# Patient Record
Sex: Male | Born: 1980 | Race: White | Hispanic: No | Marital: Married | Smoking: Never smoker
Health system: Southern US, Community
[De-identification: ages and names within clinical notes are randomized; demographics above are authoritative.]

---

## 2016-05-08 ENCOUNTER — Emergency Department (HOSPITAL_BASED_OUTPATIENT_CLINIC_OR_DEPARTMENT_OTHER): Payer: Commercial Indemnity

## 2016-05-08 ENCOUNTER — Encounter (HOSPITAL_BASED_OUTPATIENT_CLINIC_OR_DEPARTMENT_OTHER): Payer: Self-pay | Admitting: Emergency Medicine

## 2016-05-08 ENCOUNTER — Emergency Department (HOSPITAL_BASED_OUTPATIENT_CLINIC_OR_DEPARTMENT_OTHER)
Admission: EM | Admit: 2016-05-08 | Discharge: 2016-05-08 | Disposition: A | Discharge status: Home / Self Care | Payer: Commercial Indemnity | Attending: Emergency Medicine | Admitting: Emergency Medicine

## 2016-05-08 DIAGNOSIS — R61 Generalized hyperhidrosis: Secondary | ICD-10-CM | POA: Diagnosis not present

## 2016-05-08 DIAGNOSIS — R110 Nausea: Secondary | ICD-10-CM | POA: Insufficient documentation

## 2016-05-08 DIAGNOSIS — M549 Dorsalgia, unspecified: Secondary | ICD-10-CM | POA: Diagnosis not present

## 2016-05-08 DIAGNOSIS — R6889 Other general symptoms and signs: Secondary | ICD-10-CM

## 2016-05-08 DIAGNOSIS — R5383 Other fatigue: Secondary | ICD-10-CM | POA: Diagnosis not present

## 2016-05-08 DIAGNOSIS — R05 Cough: Secondary | ICD-10-CM | POA: Insufficient documentation

## 2016-05-08 DIAGNOSIS — R509 Fever, unspecified: Secondary | ICD-10-CM | POA: Insufficient documentation

## 2016-05-08 LAB — COMPREHENSIVE METABOLIC PANEL
ALK PHOS: 54 U/L (ref 38–126)
ALT: 46 U/L (ref 17–63)
ANION GAP: 9 (ref 5–15)
AST: 32 U/L (ref 15–41)
Albumin: 4.2 g/dL (ref 3.5–5.0)
BUN: 11 mg/dL (ref 6–20)
CALCIUM: 9.3 mg/dL (ref 8.9–10.3)
CHLORIDE: 98 mmol/L — AB (ref 101–111)
CO2: 30 mmol/L (ref 22–32)
Creatinine, Ser: 1 mg/dL (ref 0.61–1.24)
GFR calc non Af Amer: >60 mL/min (ref >60)
GLUCOSE: 113 mg/dL — AB (ref 65–99)
POTASSIUM: 3.5 mmol/L (ref 3.5–5.1)
SODIUM: 137 mmol/L (ref 135–145)
Total Bilirubin: 0.7 mg/dL (ref 0.3–1.2)
Total Protein: 8.1 g/dL (ref 6.5–8.1)

## 2016-05-08 LAB — CBC WITH DIFFERENTIAL/PLATELET
BASOS PCT: 0 %
Basophils Absolute: 0 10*3/uL (ref 0.0–0.1)
EOS PCT: 1 %
Eosinophils Absolute: 0.1 10*3/uL (ref 0.0–0.7)
HCT: 44.9 % (ref 39.0–52.0)
Hemoglobin: 15.2 g/dL (ref 13.0–17.0)
LYMPHS ABS: 1.3 10*3/uL (ref 0.7–4.0)
Lymphocytes Relative: 14 %
MCH: 29.5 pg (ref 26.0–34.0)
MCHC: 33.9 g/dL (ref 30.0–36.0)
MCV: 87 fL (ref 78.0–100.0)
MONOS PCT: 16 %
Monocytes Absolute: 1.5 10*3/uL — ABNORMAL HIGH (ref 0.1–1.0)
NEUTROS PCT: 69 %
Neutro Abs: 6.4 10*3/uL (ref 1.7–7.7)
PLATELETS: 191 10*3/uL (ref 150–400)
RBC: 5.16 MIL/uL (ref 4.22–5.81)
RDW: 13 % (ref 11.5–15.5)
WBC: 9.2 10*3/uL (ref 4.0–10.5)

## 2016-05-08 LAB — URINALYSIS, MICROSCOPIC (REFLEX)

## 2016-05-08 LAB — URINALYSIS, ROUTINE W REFLEX MICROSCOPIC
BILIRUBIN URINE: NEGATIVE
Glucose, UA: NEGATIVE mg/dL
KETONES UR: NEGATIVE mg/dL
LEUKOCYTES UA: NEGATIVE
NITRITE: NEGATIVE
PH: 6.5 (ref 5.0–8.0)
Protein, ur: NEGATIVE mg/dL
SPECIFIC GRAVITY, URINE: 1.011 (ref 1.005–1.030)

## 2016-05-08 MED ORDER — DM-GUAIFENESIN ER 30-600 MG PO TB12: 1.0000 | ORAL_TABLET | Freq: Two times a day (BID) | ORAL | 1 refills | Status: AC

## 2016-05-08 MED ORDER — ONDANSETRON HCL 4 MG/2ML IJ SOLN
4.0000 mg | Freq: Once | INTRAMUSCULAR | Status: DC
  Filled 2016-05-08: qty 2

## 2016-05-08 MED ORDER — SODIUM CHLORIDE 0.9 % IV BOLUS (SEPSIS)
1000.0000 mL | Freq: Once | INTRAVENOUS | Status: AC
  Administered 2016-05-08: 1000 mL via INTRAVENOUS

## 2016-05-08 MED ORDER — NAPROXEN 500 MG PO TABS: 500.0000 mg | ORAL_TABLET | Freq: Two times a day (BID) | ORAL | 1 refills | Status: AC

## 2016-05-08 MED ORDER — SODIUM CHLORIDE 0.9 % IV SOLN: INTRAVENOUS | Status: DC

## 2016-05-08 NOTE — ED Provider Notes (Signed)
[facility] Note   CSN: [id number] Arrival date & time: 05/08/16  2007   By signing my name below, I, Nelwyn SalisburyJoshua Fowler, attest that this documentation has been prepared under the direction and in the presence of Vanetta MuldersScott [place], MD . Electronically Signed: Nelwyn SalisburyJoshua Fowler, Scribe. 05/08/2016. 9:22 PM.  History   Chief Complaint Chief Complaint  Patient presents with  . Back Pain   The history is provided by the patient. No language interpreter was used.    HPI Comments:  Brad Delgado is an otherwise healthy 35 y.o. male who presents to the Emergency Department complaining of sudden-onset intermittent fever beginning earlier today. Pt states that he tried sleeping for his symptoms with moderate relief. He reports associated nausea, fatigue, diaphoresis, chills, cough, and nausea. He denies any rhinorrhea, sore throat, visual disturbance, SOB, CP, abdominal pain, diarrhea, vomiting, dysuria, joint swelling, rash, headache, numbness or bleeding problems. Pt states he has had some sick contacts recently.  Pt secondarily complains of constant unchanged back pain beginning about a week ago. Pt states that his pain sometimes prevents him from tying his shoes. He denies any injury to his back.  History reviewed. No pertinent past medical history.  There are no active problems to display for this patient.   History reviewed. No pertinent surgical history.   Home Medications    Prior to Admission medications   Medication Sig Start Date End Date Taking? Authorizing Provider  dextromethorphan-guaiFENesin (MUCINEX DM) 30-600 MG 12hr tablet Take 1 tablet by mouth 2 (two) times daily. 05/08/16   Vanetta MuldersScott Kysean Sweet, MD  naproxen (NAPROSYN) 500 MG tablet Take 1 tablet (500 mg total) by mouth 2 (two) times daily. 05/08/16   Vanetta MuldersScott Luvia Orzechowski, MD    Family History History reviewed. No pertinent family history.  Social History Social History  Substance Use Topics  . Smoking  status: Never Smoker  . Smokeless tobacco: Never Used  . Alcohol use No     Allergies   Asa [aspirin]   Review of Systems Review of Systems  Constitutional: Positive for chills, diaphoresis, fatigue and fever.  HENT: Negative for rhinorrhea and sore throat.   Eyes: Negative for visual disturbance.  Respiratory: Positive for cough (Productive). Negative for shortness of breath.   Cardiovascular: Negative for chest pain.  Gastrointestinal: Positive for nausea. Negative for abdominal pain, diarrhea and vomiting.  Genitourinary: Negative for dysuria.  Musculoskeletal: Positive for back pain. Negative for joint swelling.  Skin: Negative for rash and wound.  Neurological: Negative for numbness and headaches.  Hematological: Does not bruise/bleed easily.     Physical Exam Updated Vital Signs BP 113/74 (BP Location: Right Arm)   Pulse 103   Temp 98 F (36.7 C) (Oral)   Resp 20   Ht 5\' 11"  (1.803 m)   Wt 117.9 kg   SpO2 97%   BMI 36.26 kg/m   Physical Exam  Constitutional: He is oriented to person, place, and time. He appears well-developed and well-nourished. No distress.  HENT:  Head: Normocephalic and atraumatic.  Mouth/Throat: Oropharynx is clear and moist.  Eyes: Conjunctivae and EOM are normal. Pupils are equal, round, and reactive to light. No scleral icterus.  Cardiovascular: Normal rate, regular rhythm and normal heart sounds.   Pulmonary/Chest: Effort normal and breath sounds normal.  Abdominal: Soft. Bowel sounds are normal. He exhibits no distension. There is no tenderness.  Musculoskeletal:  No ankle swelling  Neurological: He is alert and oriented to person, place, and time. No cranial nerve deficit  or sensory deficit. He exhibits normal muscle tone. Coordination normal.  Skin: Skin is warm and dry.  Psychiatric: He has a normal mood and affect.  Nursing note and vitals reviewed.    ED Treatments / Results  DIAGNOSTIC STUDIES:  Oxygen Saturation is 96%  on RA, adequate by my interpretation.    COORDINATION OF CARE:  9:37 PM Discussed treatment plan with pt at bedside which includes imaging and blood work and pt agreed to plan.  Labs (all labs ordered are listed, but only abnormal results are displayed) Labs Reviewed  URINALYSIS, ROUTINE W REFLEX MICROSCOPIC - Abnormal; Notable for the following:       Result Value   Hgb urine dipstick MODERATE (*)    All other components within normal limits  URINALYSIS, MICROSCOPIC (REFLEX) - Abnormal; Notable for the following:    Bacteria, UA RARE (*)    Squamous Epithelial / LPF 0-5 (*)    All other components within normal limits  CBC WITH DIFFERENTIAL/PLATELET - Abnormal; Notable for the following:    Monocytes Absolute 1.5 (*)    All other components within normal limits  COMPREHENSIVE METABOLIC PANEL - Abnormal; Notable for the following:    Chloride 98 (*)    Glucose, Bld 113 (*)    All other components within normal limits    EKG  EKG Interpretation None       Radiology Dg Chest 2 View  Result Date: 05/08/2016 CLINICAL DATA:  35 year old male with fever and cough. EXAM: CHEST  2 VIEW COMPARISON:  None. FINDINGS: The heart size and mediastinal contours are within normal limits. Both lungs are clear. The visualized skeletal structures are unremarkable. IMPRESSION: No active cardiopulmonary disease. Electronically Signed   By: Elgie CollardArash  Radparvar M.D.   On: 05/08/2016 22:02    Procedures Procedures (including critical care time)  Medications Ordered in ED Medications  0.9 %  sodium chloride infusion (not administered)  ondansetron (ZOFRAN) injection 4 mg (not administered)  sodium chloride 0.9 % bolus 1,000 mL (1,000 mLs Intravenous New Bag/Given 05/08/16 2200)     Initial Impression / Assessment and Plan / ED Course  I have reviewed the triage vital signs and the nursing notes.  Pertinent labs & imaging results that were available during my care of the patient were reviewed  by me and considered in my medical decision making (see chart for details).  Clinical Course    Patient nontoxic no acute distress. Symptoms do appear to be consistent with a flulike illness. Chest x-ray negative for pneumonia. Will treat symptomatically. Patient did not have flu shot. Based on symptoms being fairly minimal at this point in time will not start Tamiflu. Patient will return for any new or worse symptoms.   Final Clinical Impressions(s) / ED Diagnoses   Final diagnoses:  Flu-like symptoms    New Prescriptions New Prescriptions   DEXTROMETHORPHAN-GUAIFENESIN (MUCINEX DM) 30-600 MG 12HR TABLET    Take 1 tablet by mouth 2 (two) times daily.   NAPROXEN (NAPROSYN) 500 MG TABLET    Take 1 tablet (500 mg total) by mouth 2 (two) times daily.  I personally performed the services described in this documentation, which was scribed in my presence. The recorded information has been reviewed and is accurate.       Vanetta MuldersScott Marguerite Barba, MD 05/08/16 [phone]

## 2016-05-08 NOTE — ED Notes (Signed)
Patient became pasty white while sticking him for an IV and drawing blood. Then c/o nausea

## 2016-05-08 NOTE — ED Triage Notes (Addendum)
Patient reports that he is having lower back pain across his back, patient reports that it feels like it is pulling when he bends over this is the only time that he feels the pain . The patient reports that he started to have a fever last night.  Patient was exposed to someone with the flu about 1 week ago. Started having a cough 2 days ago

## 2016-05-08 NOTE — ED Notes (Signed)
Patient states he started feeling bad yesterday.  +fever, sleepy, back pain at mid lower back.  Has been able to drink some fluids, been taking OTC meds for fever and cough.  Slight nausea no vomiting or diarrhea.  States he did have fever at home but began sweating on the wat here

## 2016-05-08 NOTE — Discharge Instructions (Signed)
Chest x-ray negative for pneumonia. Symptoms consistent with a flulike illness. Treat symptomatically. Drink plenty of fluids. Work note provided. Return for any new or worse symptoms at all.

## 2016-05-08 NOTE — ED Notes (Signed)
Pt and wife given d/c instructions as per chart. Rx x 2. Verbalize understanding. No questions.

## 2016-05-08 NOTE — ED Notes (Signed)
No longer c/o nausea requesting to wait on the nausea med

## 2017-11-29 ENCOUNTER — Other Ambulatory Visit: Payer: Self-pay | Admitting: Neurological Surgery

## 2017-11-29 DIAGNOSIS — M545 Low back pain: Secondary | ICD-10-CM

## 2017-12-01 ENCOUNTER — Ambulatory Visit
Admission: RE | Admit: 2017-12-01 | Discharge: 2017-12-01 | Disposition: A | Discharge status: Home / Self Care | Payer: Commercial Indemnity | Source: Ambulatory Visit | Attending: Neurological Surgery | Admitting: Neurological Surgery

## 2017-12-01 DIAGNOSIS — M545 Low back pain: Secondary | ICD-10-CM

## 2019-09-29 IMAGING — MR MR LUMBAR SPINE W/O CM
4 of 5 series · 18 of 48 positions shown · non-contrast
Comparison: None available.

CLINICAL DATA: Initial evaluation for low back pain with left lower
extremity pain for over 1 year.

EXAM:
MRI LUMBAR SPINE WITHOUT CONTRAST
TECHNIQUE: Multiplanar, multisequence MR imaging of the lumbar spine was
performed. No intravenous contrast was administered.

[Series 5: T2 · sagittal · 4.0mm · 0.73mm/px · 6 of 13 slices shown (1 of 2)]
[im 1/13]
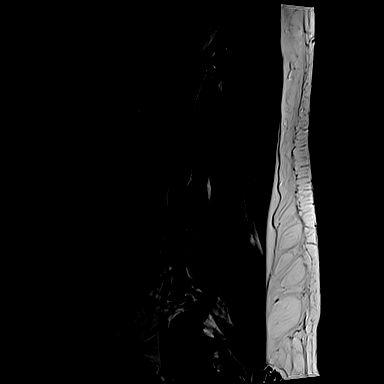
[im 3/13]
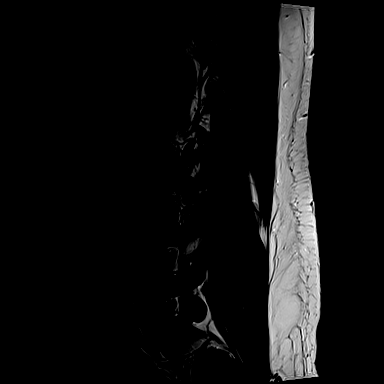
[im 5/13]
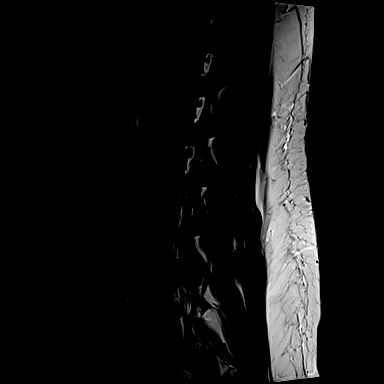
[im 8/13]
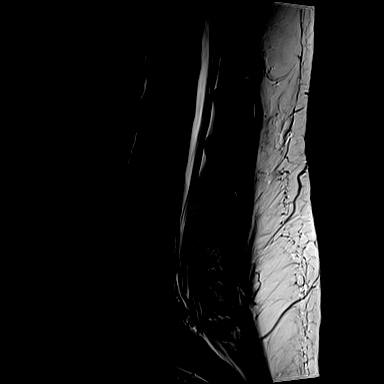
[im 10/13]
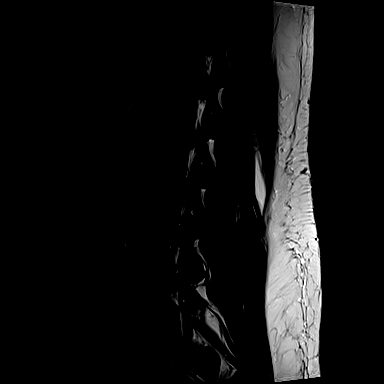
[im 13/13]
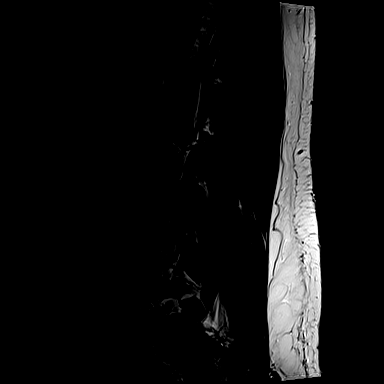

[Series 6: T1 · sagittal · 4.0mm · 0.73mm/px · 3 of 13 slices shown (1 of 2)]
[im 1/13]
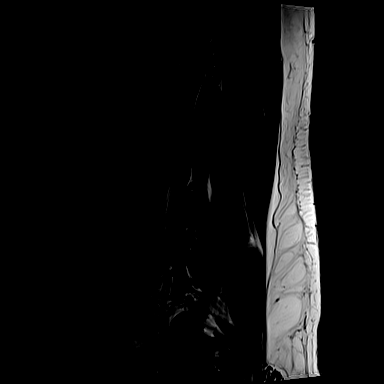
[im 7/13]
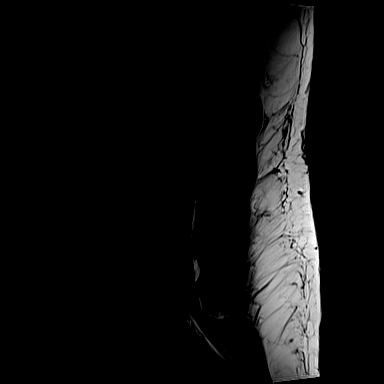
[im 13/13]
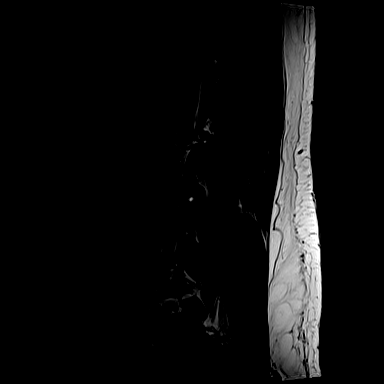

[Series 10: T1 · axial · 4.0mm · 0.30mm/px · z∈[-111,+56]mm · 3 of 40 slices shown (2 of 2)]
[im 6/40]
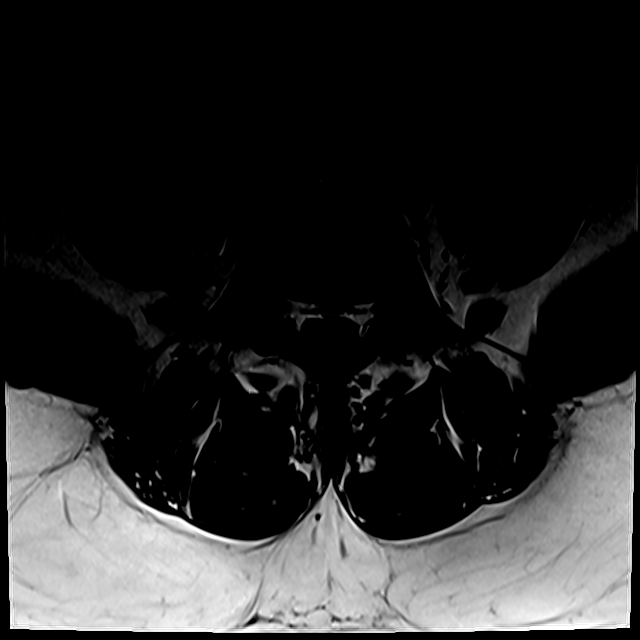
[im 21/40]
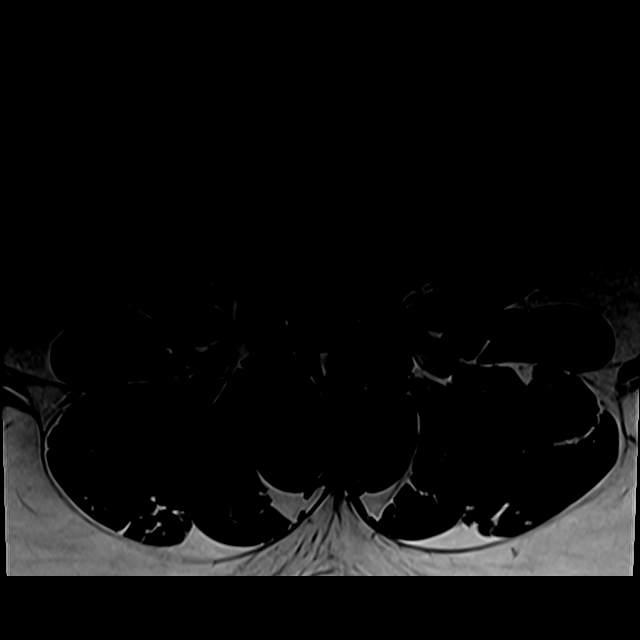
[im 34/40]
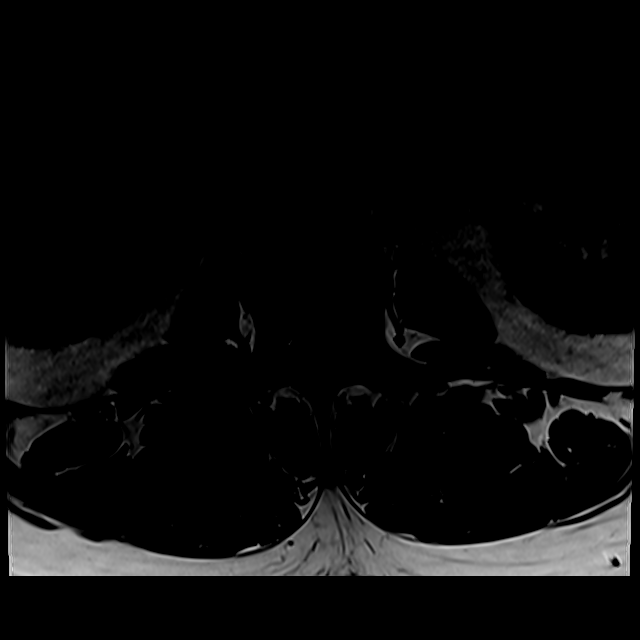

[Series 13: T2 · axial · 4.0mm · 0.30mm/px · z∈[-126,+56]mm · 6 of 40 slices shown (2 of 2)]
[im 3/40]
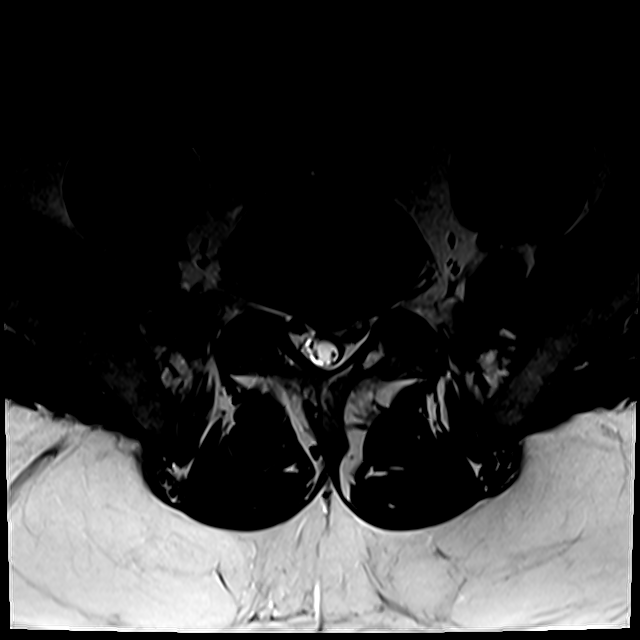
[im 6/40]
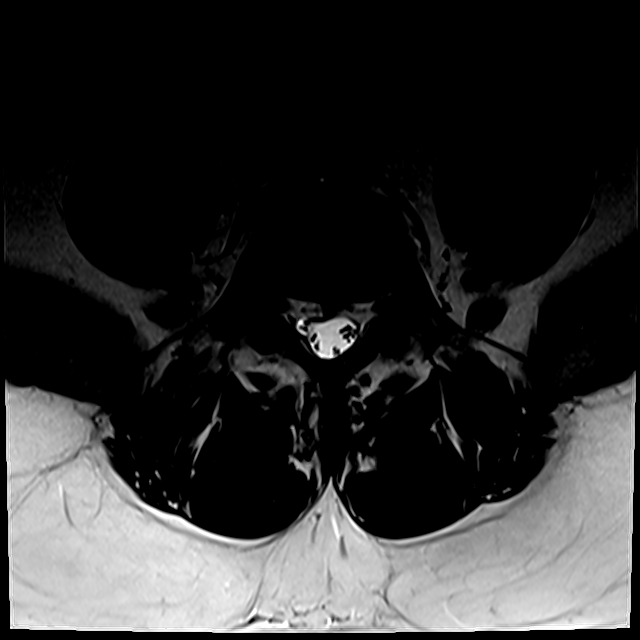
[im 8/40]
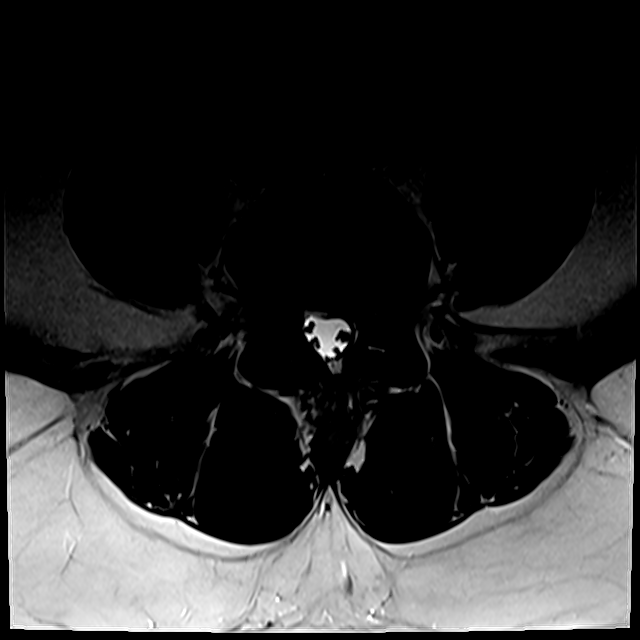
[im 14/40]
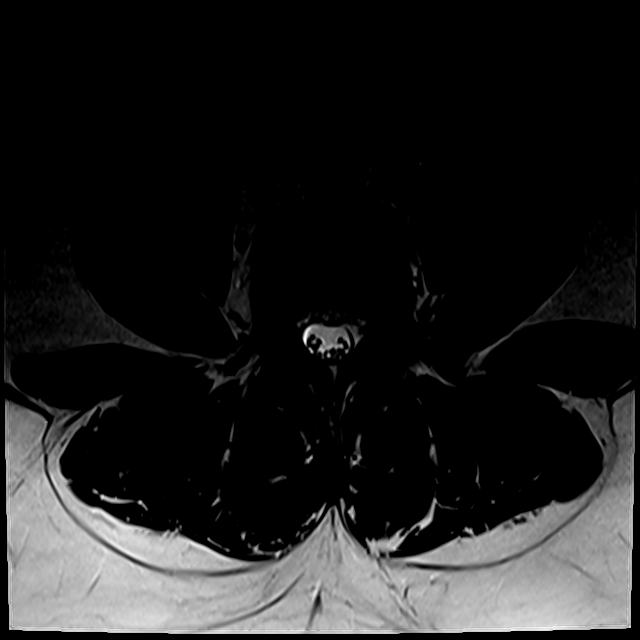
[im 21/40]
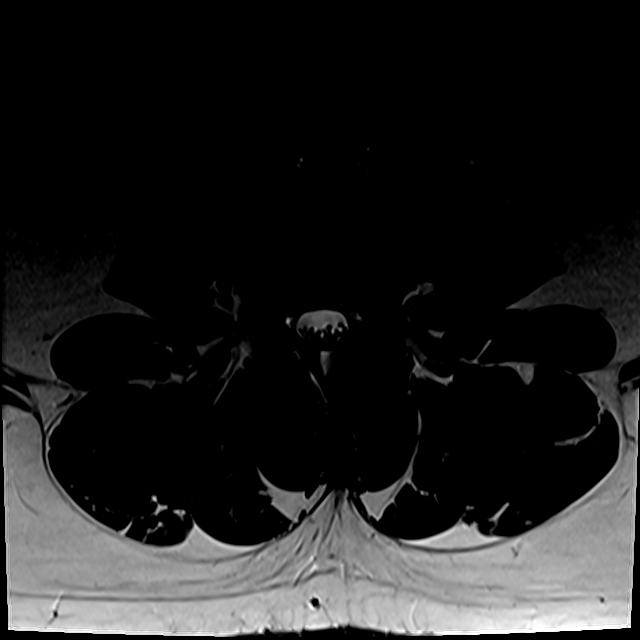
[im 34/40]
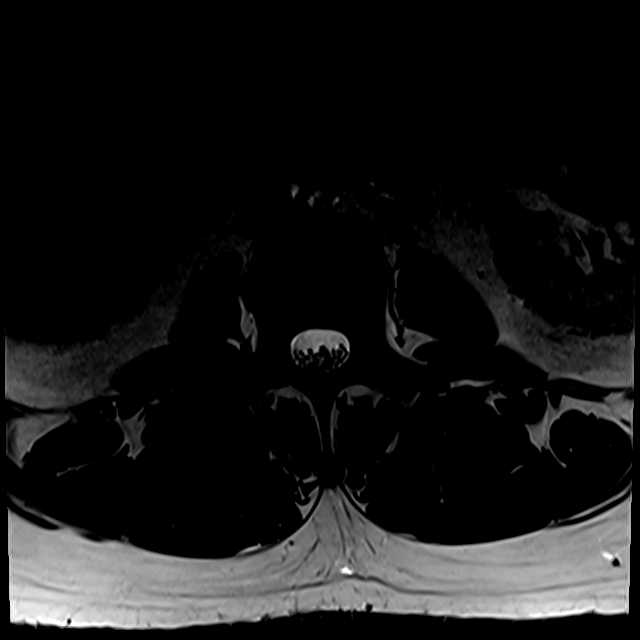

[18 of 48 positions shown; findings below may reference images not displayed]

FINDINGS: Segmentation: Normal segmentation. Lowest well-formed disc labeled
the L5-S1 level.

Alignment: Mild straightening of the normal lumbar lordosis. No
listhesis.

Vertebrae: Vertebral body heights maintained without evidence for
acute or chronic fracture. Bone marrow signal intensity within
normal limits. No discrete or worrisome osseous lesions. No abnormal
marrow edema.

Conus medullaris and cauda equina: Conus extends to the T12 level.
Conus and cauda equina appear normal.

Paraspinal and other soft tissues: Paraspinous soft tissues within
normal limits. Subcentimeter T2 hyperintense cyst noted within the
interpolar left kidney. Visualized visceral structures otherwise
unremarkable.

Disc levels:

No significant findings are seen through the L3-4 level.

L4-5: Disc desiccation. Left subarticular disc protrusion with
inferior migration, extends into the left lateral recess and
impinges upon the descending left L5 nerve root (series 13, image
32). Moderate left lateral recess narrowing without significant
canal stenosis. Foramina remain patent.

L5-S1: Disc desiccation with chronic intervertebral disc space
narrowing. Broad central disc protrusion, slightly asymmetric to the
left. Protruding disc contacts the descending S1 nerve roots
bilaterally as they course through the lateral recesses, left
greater than right. Central canal remains patent. No significant
foraminal stenosis.
IMPRESSION: 1. Left subarticular disc protrusion with inferior migration at L4-5
with secondary left L5 nerve root impingement.
2. Broad central disc protrusion at L5-S1, contacting the descending
S1 nerve roots bilaterally without frank impingement, left greater
than right.
